# Patient Record
Sex: Female | Born: 2005 | Race: White | Hispanic: No | Marital: Single | Smoking: Never smoker
Health system: Southern US, Community
[De-identification: ages and names within clinical notes are randomized; demographics above are authoritative.]

## PROBLEM LIST (undated history)

## (undated) DIAGNOSIS — F419 Anxiety disorder, unspecified: Secondary | ICD-10-CM

## (undated) DIAGNOSIS — F32A Depression, unspecified: Secondary | ICD-10-CM

---
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
## 2023-04-06 ENCOUNTER — Emergency Department (HOSPITAL_COMMUNITY)
Admission: EM | Admit: 2023-04-06 | Discharge: 2023-04-06 | Discharge status: Against Medical Advice | Payer: BC Managed Care – PPO | Attending: Emergency Medicine | Admitting: Emergency Medicine

## 2023-04-06 ENCOUNTER — Encounter (HOSPITAL_COMMUNITY): Payer: Self-pay | Admitting: Emergency Medicine

## 2023-04-06 ENCOUNTER — Other Ambulatory Visit: Payer: Self-pay

## 2023-04-06 DIAGNOSIS — Z5321 Procedure and treatment not carried out due to patient leaving prior to being seen by health care provider: Secondary | ICD-10-CM | POA: Insufficient documentation

## 2023-04-06 DIAGNOSIS — R55 Syncope and collapse: Secondary | ICD-10-CM | POA: Diagnosis present

## 2023-04-06 HISTORY — DX: Depression, unspecified: F32.A

## 2023-04-06 HISTORY — DX: Anxiety disorder, unspecified: F41.9

## 2023-04-06 LAB — CBC WITH DIFFERENTIAL/PLATELET
Abs Immature Granulocytes: 0.04 K/uL (ref 0.00–0.07)
Basophils Absolute: 0.1 K/uL (ref 0.0–0.1)
Basophils Relative: 1 %
Eosinophils Absolute: 0.1 K/uL (ref 0.0–0.5)
Eosinophils Relative: 1 %
HCT: 40.2 % (ref 36.0–46.0)
Hemoglobin: 13.7 g/dL (ref 12.0–15.0)
Immature Granulocytes: 0 %
Lymphocytes Relative: 17 %
Lymphs Abs: 2.3 K/uL (ref 0.7–4.0)
MCH: 29.7 pg (ref 26.0–34.0)
MCHC: 34.1 g/dL (ref 30.0–36.0)
MCV: 87.2 fL (ref 80.0–100.0)
Monocytes Absolute: 0.7 K/uL (ref 0.1–1.0)
Monocytes Relative: 6 %
Neutro Abs: 10 K/uL — ABNORMAL HIGH (ref 1.7–7.7)
Neutrophils Relative %: 75 %
Platelets: 227 K/uL (ref 150–400)
RBC: 4.61 MIL/uL (ref 3.87–5.11)
RDW: 11.7 % (ref 11.5–15.5)
WBC: 13.2 K/uL — ABNORMAL HIGH (ref 4.0–10.5)
nRBC: 0 % (ref 0.0–0.2)

## 2023-04-06 LAB — URINALYSIS, ROUTINE W REFLEX MICROSCOPIC
Bilirubin Urine: NEGATIVE
Glucose, UA: NEGATIVE mg/dL
Hgb urine dipstick: NEGATIVE
Ketones, ur: 20 mg/dL — AB
Leukocytes,Ua: NEGATIVE
Nitrite: NEGATIVE
Protein, ur: NEGATIVE mg/dL
Specific Gravity, Urine: 1.004 — ABNORMAL LOW (ref 1.005–1.030)
pH: 7 (ref 5.0–8.0)

## 2023-04-06 LAB — COMPREHENSIVE METABOLIC PANEL WITH GFR
ALT: 13 U/L (ref 0–44)
AST: 27 U/L (ref 15–41)
Albumin: 4.9 g/dL (ref 3.5–5.0)
Alkaline Phosphatase: 57 U/L (ref 38–126)
Anion gap: 11 (ref 5–15)
BUN: 7 mg/dL (ref 6–20)
CO2: 22 mmol/L (ref 22–32)
Calcium: 9.9 mg/dL (ref 8.9–10.3)
Chloride: 105 mmol/L (ref 98–111)
Creatinine, Ser: 0.75 mg/dL (ref 0.44–1.00)
GFR, Estimated: >60 mL/min
Glucose, Bld: 96 mg/dL (ref 70–99)
Potassium: 3.9 mmol/L (ref 3.5–5.1)
Sodium: 138 mmol/L (ref 135–145)
Total Bilirubin: 1 mg/dL
Total Protein: 8.3 g/dL — ABNORMAL HIGH (ref 6.5–8.1)

## 2023-04-06 LAB — HCG, SERUM, QUALITATIVE: Preg, Serum: NEGATIVE
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
## 2023-04-06 NOTE — ED Triage Notes (Signed)
Pt reports she was on her phone at work when she felt cold tingling on both side of her face. Pt reports she lost consciousness for around 1 minutes.

## 2023-04-06 NOTE — ED Notes (Signed)
Pt did not answer to call for room

## 2023-04-09 DIAGNOSIS — F419 Anxiety disorder, unspecified: Secondary | ICD-10-CM | POA: Insufficient documentation

## 2023-04-09 DIAGNOSIS — B9689 Other specified bacterial agents as the cause of diseases classified elsewhere: Secondary | ICD-10-CM | POA: Insufficient documentation

## 2023-04-09 DIAGNOSIS — F329 Major depressive disorder, single episode, unspecified: Secondary | ICD-10-CM | POA: Insufficient documentation

## 2023-06-20 ENCOUNTER — Ambulatory Visit (HOSPITAL_BASED_OUTPATIENT_CLINIC_OR_DEPARTMENT_OTHER)
Admission: EM | Admit: 2023-06-20 | Discharge: 2023-06-20 | Disposition: A | Discharge status: Home / Self Care | Payer: BC Managed Care – PPO | Attending: Family Medicine | Admitting: Family Medicine

## 2023-06-20 ENCOUNTER — Encounter (HOSPITAL_BASED_OUTPATIENT_CLINIC_OR_DEPARTMENT_OTHER): Payer: Self-pay

## 2023-06-20 DIAGNOSIS — J069 Acute upper respiratory infection, unspecified: Secondary | ICD-10-CM

## 2023-06-20 DIAGNOSIS — R509 Fever, unspecified: Secondary | ICD-10-CM

## 2023-06-20 LAB — POCT RAPID STREP A (OFFICE): Rapid Strep A Screen: NEGATIVE

## 2023-06-20 LAB — POC COVID19/FLU A&B COMBO
Covid Antigen, POC: NEGATIVE
Influenza A Antigen, POC: NEGATIVE
Influenza B Antigen, POC: NEGATIVE

## 2023-06-20 MED ORDER — PROMETHAZINE-DM 6.25-15 MG/5ML PO SYRP: 5.0000 mL | ORAL_SOLUTION | Freq: Four times a day (QID) | ORAL | 0 refills | Status: AC | PRN

## 2023-06-20 MED ORDER — OSELTAMIVIR PHOSPHATE 75 MG PO CAPS: 75.0000 mg | ORAL_CAPSULE | Freq: Two times a day (BID) | ORAL | 0 refills | Status: AC

## 2023-06-20 NOTE — ED Triage Notes (Signed)
Pt states that she has a sore throat, cough, nausea, body aches and nasal congestion x1 day Pt was exposed to flu A.

## 2023-06-20 NOTE — ED Provider Notes (Signed)
[facility]    CSN: [id number] Arrival date & time: 06/20/23  1215      History   Chief Complaint Chief Complaint  Patient presents with   Sore Throat    Sore throat, cough, nausea, body aches and nasal congestion.    HPI Tamara Herman is a 18 y.o. female.   Patient's boyfriend was diagnosed yesterday at this urgent care with influenza type A.  She started with a high fever last night as high as 102.  She has had fever through the night, cough and lots of bodyaches.  She feels miserable like she has been run over by a truck.   Sore Throat Pertinent negatives include no chest pain, no abdominal pain and no shortness of breath.    Past Medical History:  Diagnosis Date   Anxiety    Depression     Patient Active Problem List   Diagnosis Date Noted   Concussion with loss of consciousness 08/27/2020   PTSD (post-traumatic stress disorder) 08/27/2020    History reviewed. No pertinent surgical history.  OB History   No obstetric history on file.      Home Medications    Prior to Admission medications   Medication Sig Start Date End Date Taking? Authorizing Provider  oseltamivir (TAMIFLU) 75 MG capsule Take 1 capsule (75 mg total) by mouth every 12 (twelve) hours. 06/20/23  Yes Prescilla Sours, FNP  promethazine-dextromethorphan (PROMETHAZINE-DM) 6.25-15 MG/5ML syrup Take 5 mLs by mouth 4 (four) times daily as needed. May make drowsy, do no use and drive. 06/20/23  Yes Prescilla Sours, FNP  FLUoxetine (PROZAC) 20 MG capsule TAKE 1 CAPSULE BY MOUTH EVERY DAY 07/15/22   Rodolph Bong, MD    Family History History reviewed. No pertinent family history.  Social History Social History   Tobacco Use   Smoking status: Never   Smokeless tobacco: Never  Vaping Use   Vaping status: Never Used  Substance Use Topics   Alcohol use: Never   Drug use: Never     Allergies   Patient has no known allergies.   Review of Systems Review of Systems  Constitutional:   Positive for fever. Negative for chills.  HENT:  Positive for congestion. Negative for ear pain and sore throat.   Eyes:  Negative for pain and visual disturbance.  Respiratory:  Positive for cough. Negative for shortness of breath.   Cardiovascular:  Negative for chest pain and palpitations.  Gastrointestinal:  Negative for abdominal pain, constipation, diarrhea, nausea and vomiting.  Genitourinary:  Negative for dysuria and hematuria.  Musculoskeletal:  Positive for arthralgias. Negative for back pain.  Skin:  Negative for color change and rash.  Neurological:  Negative for seizures and syncope.  All other systems reviewed and are negative.    Physical Exam Triage Vital Signs ED Triage Vitals  Encounter Vitals Group     BP 06/20/23 1423 104/70     Systolic BP Percentile --      Diastolic BP Percentile --      Pulse Rate 06/20/23 1423 (!) 133     Resp 06/20/23 1423 (!) 22     Temp 06/20/23 1423 99.3 F (37.4 C)     Temp Source 06/20/23 1423 Oral     SpO2 06/20/23 1423 99 %     Weight 06/20/23 1420 100 lb (45.4 kg)     Height 06/20/23 1420 5\' 1"  (1.549 m)     Head Circumference --      Peak Flow --  Pain Score 06/20/23 1420 8     Pain Loc --      Pain Education --      Exclude from Growth Chart --    No data found.  Updated Vital Signs BP 104/70 (BP Location: Right Arm)   Pulse (!) 133   Temp 99.3 F (37.4 C) (Oral)   Resp (!) 22   Ht 5\' 1"  (1.549 m)   Wt 100 lb (45.4 kg)   LMP 06/04/2023   SpO2 99%   BMI 18.89 kg/m   Visual Acuity Right Eye Distance:   Left Eye Distance:   Bilateral Distance:    Right Eye Near:   Left Eye Near:    Bilateral Near:     Physical Exam Vitals and nursing note reviewed.  Constitutional:      General: She is not in acute distress.    Appearance: She is well-developed. She is ill-appearing. She is not toxic-appearing.  HENT:     Head: Normocephalic and atraumatic.     Right Ear: Hearing, tympanic membrane, ear canal and  external ear normal.     Left Ear: Hearing, tympanic membrane, ear canal and external ear normal.     Nose: Congestion and rhinorrhea present. Rhinorrhea is clear.     Mouth/Throat:     Lips: Pink.     Mouth: Mucous membranes are moist.     Pharynx: Uvula midline. Posterior oropharyngeal erythema present. No oropharyngeal exudate.     Tonsils: No tonsillar exudate.  Eyes:     Conjunctiva/sclera: Conjunctivae normal.     Pupils: Pupils are equal, round, and reactive to light.  Cardiovascular:     Rate and Rhythm: Normal rate and regular rhythm.     Heart sounds: S1 normal and S2 normal. No murmur heard. Pulmonary:     Effort: Pulmonary effort is normal. No respiratory distress.     Breath sounds: Normal breath sounds. No decreased breath sounds, wheezing, rhonchi or rales.  Abdominal:     Palpations: Abdomen is soft.     Tenderness: There is no abdominal tenderness.  Musculoskeletal:        General: No swelling.     Cervical back: Neck supple.  Lymphadenopathy:     Head:     Right side of head: No submental, submandibular, tonsillar, preauricular or posterior auricular adenopathy.     Left side of head: No submental, submandibular, tonsillar, preauricular or posterior auricular adenopathy.     Cervical: No cervical adenopathy.     Right cervical: No superficial cervical adenopathy.    Left cervical: No superficial cervical adenopathy.  Skin:    General: Skin is warm and dry.     Capillary Refill: Capillary refill takes less than 2 seconds.     Findings: No rash.  Neurological:     Mental Status: She is alert and oriented to person, place, and time.  Psychiatric:        Mood and Affect: Mood normal.      UC Treatments / Results  Labs (all labs ordered are listed, but only abnormal results are displayed) Labs Reviewed  POC COVID19/FLU A&B COMBO - Normal  POCT RAPID STREP A (OFFICE) - Normal    EKG   Radiology No results found.  Procedures Procedures (including  critical care time)  Medications Ordered in UC Medications - No data to display  Initial Impression / Assessment and Plan / UC Course  I have reviewed the triage vital signs and the nursing notes.  Pertinent  labs & imaging results that were available during my care of the patient were reviewed by me and considered in my medical decision making (see chart for details).  Her boyfriend or partner was diagnosed with the flu yesterday.  She had new onset symptoms last night and during the night, with fever of 102.  Rapid flu, COVID, strep are all negative.  Based on symptoms we will treat with Tamiflu, 75 mg, twice daily for 5 days.  Get plenty of fluids and rest.  Promethazine DM, 5 mL, every 6 hours, as needed for cough.  Recheck if symptoms do not improve, worsen or new symptoms occur. Final Clinical Impressions(s) / UC Diagnoses   Final diagnoses:  Viral URI with cough  Fever, unspecified     Discharge Instructions      Rapid flu, strep, COVID are all negative.  Her partner was diagnosed with flu yesterday.  She has had fevers of as high as 102 in the last 24 hours.  Will treat for influenza type a with Tamiflu, 75 mg, twice daily for 5 days.  Promethazine DM, 5 mL, every 6 hours, as needed for cough.  Get plenty of fluids and rest.  Given a work excuse.  Follow-up here if symptoms do not improve, worsen or new symptoms occur.     ED Prescriptions     Medication Sig Dispense Auth. Provider   oseltamivir (TAMIFLU) 75 MG capsule Take 1 capsule (75 mg total) by mouth every 12 (twelve) hours. 10 capsule Prescilla Sours, FNP   promethazine-dextromethorphan (PROMETHAZINE-DM) 6.25-15 MG/5ML syrup Take 5 mLs by mouth 4 (four) times daily as needed. May make drowsy, do no use and drive. 118 mL Prescilla Sours, FNP      PDMP not reviewed this encounter.   Prescilla Sours, FNP 06/20/23 1534

## 2023-06-20 NOTE — Discharge Instructions (Addendum)
Rapid flu, strep, COVID are all negative.  Her partner was diagnosed with flu yesterday.  She has had fevers of as high as 102 in the last 24 hours.  Will treat for influenza type a with Tamiflu, 75 mg, twice daily for 5 days.  Promethazine DM, 5 mL, every 6 hours, as needed for cough.  Get plenty of fluids and rest.  Given a work excuse.  Follow-up here if symptoms do not improve, worsen or new symptoms occur.

## 2023-06-25 DIAGNOSIS — R55 Syncope and collapse: Secondary | ICD-10-CM | POA: Insufficient documentation

## 2024-03-07 DIAGNOSIS — N912 Amenorrhea, unspecified: Secondary | ICD-10-CM | POA: Insufficient documentation

## 2024-03-31 DIAGNOSIS — R140 Abdominal distension (gaseous): Secondary | ICD-10-CM | POA: Insufficient documentation

## 2024-04-24 ENCOUNTER — Ambulatory Visit: Admission: EM | Admit: 2024-04-24 | Discharge: 2024-04-24 | Disposition: A | Discharge status: Home / Self Care

## 2024-04-24 DIAGNOSIS — S50812A Abrasion of left forearm, initial encounter: Secondary | ICD-10-CM

## 2024-04-24 DIAGNOSIS — S60512A Abrasion of left hand, initial encounter: Secondary | ICD-10-CM | POA: Diagnosis not present

## 2024-04-24 DIAGNOSIS — W5503XA Scratched by cat, initial encounter: Secondary | ICD-10-CM | POA: Diagnosis not present

## 2024-04-24 DIAGNOSIS — Z23 Encounter for immunization: Secondary | ICD-10-CM | POA: Diagnosis not present

## 2024-04-24 MED ORDER — TETANUS-DIPHTH-ACELL PERTUSSIS 5-2-15.5 LF-MCG/0.5 IM SUSP
0.5000 mL | Freq: Once | INTRAMUSCULAR | Status: AC
  Administered 2024-04-24: 0.5 mL via INTRAMUSCULAR

## 2024-04-24 MED ORDER — RABIES IMMUNE GLOBULIN 300 UNIT/2ML IJ SOLN: 20.0000 [IU]/kg | Freq: Once | INTRAMUSCULAR | Status: DC

## 2024-04-24 MED ORDER — MUPIROCIN 2 % EX OINT: 1.0000 | TOPICAL_OINTMENT | Freq: Two times a day (BID) | CUTANEOUS | 1 refills | Status: AC

## 2024-04-24 MED ORDER — RABIES VIRUS VACCINE, HDC IM SUSR: 1.0000 mL | Freq: Once | INTRAMUSCULAR | Status: DC

## 2024-04-24 NOTE — ED Provider Notes (Addendum)
 [facility]    CSN: [id number] Arrival date & time: 04/24/24  1439      History   Chief Complaint Chief Complaint  Patient presents with   Animal Bite    HPI Tamara Herman is a 18 y.o. female.   18 year old female who presents urgent care after a stray cat scratched her arm.  She reports she was petting the cat on the top of the head and he came up and scratched her left hand by attacking her hand.  She does not know the status of the cat as it was stray.  She came in for rabies vaccination and possible tetanus booster.  She denies any other injury.  This occurred today.   Animal Bite Associated symptoms: no fever and no rash     Past Medical History:  Diagnosis Date   Anxiety    Depression     Patient Active Problem List   Diagnosis Date Noted   Abdominal bloating 03/31/2024   Amenorrhea 03/07/2024   Fainting 06/25/2023   Current episode of major depressive disorder without prior episode 04/09/2023   Anxiety 04/09/2023   Acute bacterial bronchitis 04/09/2023   Concussion with loss of consciousness 08/27/2020   PTSD (post-traumatic stress disorder) 08/27/2020    History reviewed. No pertinent surgical history.  OB History   No obstetric history on file.      Home Medications    Prior to Admission medications   Medication Sig Start Date End Date Taking? Authorizing Provider  FLUoxetine  (PROZAC ) 40 MG capsule Take 40 mg by mouth every morning.   Yes [provider]  mupirocin  ointment (BACTROBAN ) 2 % Apply 1 Application topically 2 (two) times daily. 04/24/24  Yes Tigerlily Christine A, PA-C  oseltamivir  (TAMIFLU ) 75 MG capsule Take 1 capsule (75 mg total) by mouth every 12 (twelve) hours. 06/20/23   Ival Domino, FNP  promethazine -dextromethorphan (PROMETHAZINE -DM) 6.25-15 MG/5ML syrup Take 5 mLs by mouth 4 (four) times daily as needed. May make drowsy, do no use and drive. 06/20/23   Ival Domino, FNP    Family History History reviewed.  No pertinent family history.  Social History Social History   Tobacco Use   Smoking status: Never    Passive exposure: Never   Smokeless tobacco: Never  Vaping Use   Vaping status: Never Used  Substance Use Topics   Alcohol use: Never   Drug use: Never     Allergies   Motrin [ibuprofen]   Review of Systems Review of Systems  Constitutional:  Negative for chills and fever.  HENT:  Negative for ear pain and sore throat.   Eyes:  Negative for pain and visual disturbance.  Respiratory:  Negative for cough and shortness of breath.   Cardiovascular:  Negative for chest pain and palpitations.  Gastrointestinal:  Negative for abdominal pain and vomiting.  Genitourinary:  Negative for dysuria and hematuria.  Musculoskeletal:  Negative for arthralgias and back pain.  Skin:  Negative for color change and rash.       Scratches on the left hand and forearm  Neurological:  Negative for seizures and syncope.  All other systems reviewed and are negative.    Physical Exam Triage Vital Signs ED Triage Vitals  Encounter Vitals Group     BP 04/24/24 1508 106/66     Girls Systolic BP Percentile --      Girls Diastolic BP Percentile --      Boys Systolic BP Percentile --      Boys  Diastolic BP Percentile --      Pulse Rate 04/24/24 1508 81     Resp 04/24/24 1508 18     Temp 04/24/24 1508 98.1 F (36.7 C)     Temp Source 04/24/24 1508 Oral     SpO2 04/24/24 1508 99 %     Weight 04/24/24 1500 130 lb (59 kg)     Height 04/24/24 1500 5' 1 (1.549 m)     Head Circumference --      Peak Flow --      Pain Score 04/24/24 1459 0     Pain Loc --      Pain Education --      Exclude from Growth Chart --    No data found.  Updated Vital Signs BP 106/66 (BP Location: Right Arm)   Pulse 81   Temp 98.1 F (36.7 C) (Oral)   Resp 18   Ht 5' 1 (1.549 m)   Wt 130 lb (59 kg)   LMP 04/04/2024 (Exact Date)   SpO2 99%   BMI 24.56 kg/m   Visual Acuity Right Eye Distance:   Left Eye  Distance:   Bilateral Distance:    Right Eye Near:   Left Eye Near:    Bilateral Near:     Physical Exam Vitals and nursing note reviewed.  Constitutional:      General: She is not in acute distress.    Appearance: She is well-developed.  HENT:     Head: Normocephalic and atraumatic.  Eyes:     Conjunctiva/sclera: Conjunctivae normal.  Cardiovascular:     Rate and Rhythm: Normal rate and regular rhythm.     Heart sounds: No murmur heard. Pulmonary:     Effort: Pulmonary effort is normal. No respiratory distress.     Breath sounds: Normal breath sounds.  Abdominal:     Palpations: Abdomen is soft.     Tenderness: There is no abdominal tenderness.  Musculoskeletal:        General: No swelling.     Cervical back: Neck supple.  Skin:    General: Skin is warm and dry.     Capillary Refill: Capillary refill takes less than 2 seconds.  Neurological:     Mental Status: She is alert.  Psychiatric:        Mood and Affect: Mood normal.      UC Treatments / Results  Labs (all labs ordered are listed, but only abnormal results are displayed) Labs Reviewed - No data to display  EKG   Radiology No results found.  Procedures Procedures (including critical care time)  Medications Ordered in UC Medications  Tdap (ADACEL) injection 0.5 mL (0.5 mLs Intramuscular Given 04/24/24 1551)    Initial Impression / Assessment and Plan / UC Course  I have reviewed the triage vital signs and the nursing notes.  Pertinent labs & imaging results that were available during my care of the patient were reviewed by me and considered in my medical decision making (see chart for details).     Need for tetanus booster  Cat scratch of forearm, left, initial encounter  Cat scratch of left hand, initial encounter   As the area was a scratch not a bite and the cat was not attacking unusual and was relatively friendly we will have you fill out paper work for animal control and they will  contact you and if they are unable to find the cat within 72 hours then return to urgent care for rabies  vaccine.   Mupirocin  ointment twice daily to the affected area until healed.  If you start to develop increased pain, redness, purulent drainage or other concerning symptoms then return to urgent care.  Tetanus updated today.  This is good for 10 years.    Final Clinical Impressions(s) / UC Diagnoses   Final diagnoses:  Need for tetanus booster  Cat scratch of forearm, left, initial encounter  Cat scratch of left hand, initial encounter     Discharge Instructions      As the area was a scratch not a bite and the cat was not attacking unusual and was relatively friendly we will have you fill out paper work for animal control and they will contact you and if they are unable to find the cat within 72 hours then return to urgent care for rabies vaccine.   Mupirocin  ointment twice daily to the affected area until healed.  If you start to develop increased pain, redness, purulent drainage or other concerning symptoms then return to urgent care.  Tetanus updated today.  This is good for 10 years.      ED Prescriptions     Medication Sig Dispense Auth. Provider   mupirocin  ointment (BACTROBAN ) 2 % Apply 1 Application topically 2 (two) times daily. 22 g Teresa Almarie LABOR, [place]      PDMP not reviewed this encounter.   Teresa Almarie LABOR [place] 04/24/24 1554    Teresa Almarie LABOR, PA-C 04/24/24 1555    Teresa Almarie LABOR, [place] 04/24/24 1556

## 2024-04-24 NOTE — Discharge Instructions (Addendum)
 As the area was a scratch not a bite and the cat was not attacking unusual and was relatively friendly we will have you fill out paper work for animal control and they will contact you and if they are unable to find the cat within 72 hours then return to urgent care for rabies vaccine.   Mupirocin  ointment twice daily to the affected area until healed.  If you start to develop increased pain, redness, purulent drainage or other concerning symptoms then return to urgent care.  Tetanus updated today.  This is good for 10 years.

## 2024-04-24 NOTE — ED Triage Notes (Signed)
 Patient reports being scratched by a stray cat about 30-40 mins ago. Not bitten. Cat was outside so no idea where it is now.

## 2024-04-24 NOTE — ED Triage Notes (Signed)
 Also seen at [facility] for pinched nerve in right Axillary area and want to know if their is anything else to do for it, while here.
